# Patient Record
Sex: Female | Born: 1965 | Race: White | Hispanic: No | Marital: Married | Smoking: Current every day smoker
Health system: Southern US, Community
[De-identification: ages and names within clinical notes are randomized; demographics above are authoritative.]

## PROBLEM LIST (undated history)

## (undated) DIAGNOSIS — G4700 Insomnia, unspecified: Secondary | ICD-10-CM

## (undated) DIAGNOSIS — F419 Anxiety disorder, unspecified: Secondary | ICD-10-CM

## (undated) HISTORY — PX: TONSILLECTOMY: SUR1361

## (undated) HISTORY — PX: ROTATOR CUFF REPAIR: SHX139

---

## 2012-12-17 ENCOUNTER — Other Ambulatory Visit: Payer: Self-pay | Admitting: Podiatry

## 2013-01-29 ENCOUNTER — Encounter (HOSPITAL_COMMUNITY): Payer: Self-pay | Admitting: Pharmacy Technician

## 2013-02-04 NOTE — Patient Instructions (Signed)
Melissa Sanders  02/04/2013   Your procedure is scheduled on:  02/13/2013  Report [facility] at  615  AM.  Call this number if you have problems the morning of surgery: [phone]   Remember:   Do not eat food or drink liquids after midnight.   Take these medicines the morning of surgery with A SIP OF WATER:  xanax   Do not wear jewelry, make-up or nail polish.  Do not wear lotions, powders, or perfumes.   Do not shave 48 hours prior to surgery. Men may shave face and neck.  Do not bring valuables to the hospital.  [facility] any belongings or valuables.               Contacts, dentures or bridgework may not be worn into surgery.  Leave suitcase in the car. After surgery it may be brought to your room.  For patients admitted to the hospital, discharge time is determined by your treatment team.               Patients discharged the day of surgery will not be allowed to drive home.  Name and phone number of your driver: family  Special Instructions: Shower using CHG 2 nights before surgery and the night before surgery.  If you shower the day of surgery use CHG.  Use special wash - you have one bottle of CHG for all showers.  You should use approximately 1/3 of the bottle for each shower.   Please read over the following fact sheets that you were given: Pain Booklet, Coughing and Deep Breathing, MRSA Information and Surgical Site Infection Prevention Hammer Toes Hammer toes occur when the joint in one or more of your toes is permanently flexed. CAUSES  This happens when a muscle imbalance or abnormal bone length makes the small toes buckle under. This causes the toe joint to contract. This causes the tendons (cord like structure) to shorten.  SYMPTOMS   When hammer toes are flexible, you can straighten the buckled joint with your hand. Flexible hammer toes may develop into rigid hammer toes over time. Common symptoms of flexible hammer toes  include:  Corns (build-ups of skin cells). Corns occur where boney bumps come in frequent contact with hard surfaces. For example, where your shoes press and rub.  Irritation.  Inflammation.  Pain.  Toe motion is limited.  When a rigid hammer toe is fixed you can no longer straighten the buckled joint. Corns, irritation, pain, and loss of motion is generally worse for rigid hammer toes than for flexible ones. TREATMENT  The problems noted above if painful or troublesome can be corrected with surgery. This is an elective surgery, so you can pick a convenient time for the procedure. The surgery may:  Improve appearance.  Relieve pain.  Improve function. You may be asked not to put weight on this foot for a few weeks. There are several types of surgical treatments. Common treatments are listed below. Your surgeon will discuss what is be best for you.   With arthroplasty, a portion of the joint is surgically removed and the toe is straightened. The "gap" fills in with fibroustissue. This helps with pain, deformity and function.  With fusion, cartilage between the two bones is taken out and the bones heal as one longer bone. This helps keep the toe stable and reduces pain but leaves the toe stiff, yet straight.  With implant, a portion of  the bone is removed and replaced with an implant to restore motion.  Flexor tendon transfers may be used to release the deforming force which buckles the toe. This is done by the repositioning of the tendons that curl the toes down (flexor tendons). Several of these options require fixing the toe with a pin that is visible at the tip of the toe. The pin keeps the toe straight during healing. It is generally removed in the office at 4-8 weeks after the corrective procedure. Generally, removing the pin is not painful.  LET YOUR CAREGIVER KNOW ABOUT:  Allergies.  Medications taken including herbs, eye drops, over the counter medications, and  creams.  Use of steroids (by mouth or creams).  Previous problems with anesthetics or novocaine.  Possibility of pregnancy, if this applies.  History of blood clots (thrombophlebitis).  History of bleeding or blood problems.  Previous surgery.  Other health problems.  Family history of anesthetic problems. BEFORE THE PROCEDURE You should be present 60 minutes prior to your procedure unless otherwise directed by your caregiver.  RISKS AND COMPLICATIONS  If surgery is recommended, your caregiver will explain your foot problem and how surgery can improve it. Your caregiver can answer questions you may have about potential risks and complications involved.  Let your caregiver know about health changes prior to surgery. It is best to do elective surgeries when you are healthy. Be sure to ask your caregiver how long you will be off your feet and if you need to be off work. Plan accordingly. Your foot and ankle may be immobilized by a cast (from your toes to below your knee). You may be asked not to bear weight on this foot for a few weeks. AFTER THE PROCEDURE You can bear weight as instructed. You may need a bandage, splint, and removable cast boot or surgical shoe for several weeks after surgery. You may resume normal diet and activities as directed. Only take over-the-counter or prescription medicines for pain, discomfort, or fever as directed by your caregiver. SEEK MEDICAL CARE IF:   You have increased bleeding (more than a small spot) from the wound.  You notice redness, swelling, or increasing pain in the wound.  You notice pus coming from the wound or the pin that is used to stabilize the toe.  You notice a bad smell coming from the wound or dressing. SEEK IMMEDIATE MEDICAL CARE IF:   You have a fever.  You develop a rash.  You have difficulty breathing.  You have any allergic problems. Document Released: 01/14/2000 Document Revised: 04/10/2011 Document Reviewed:  02/14/2008 [facility] Patient Information 2014 [place], [place]. PATIENT INSTRUCTIONS POST-ANESTHESIA  IMMEDIATELY FOLLOWING SURGERY:  Do not drive or operate machinery for the first twenty four hours after surgery.  Do not make any important decisions for twenty four hours after surgery or while taking narcotic pain medications or sedatives.  If you develop intractable nausea and vomiting or a severe headache please notify your doctor immediately.  FOLLOW-UP:  Please make an appointment with your surgeon as instructed. You do not need to follow up with anesthesia unless specifically instructed to do so.  WOUND CARE INSTRUCTIONS (if applicable):  Keep a dry clean dressing on the anesthesia/puncture wound site if there is drainage.  Once the wound has quit draining you may leave it open to air.  Generally you should leave the bandage intact for twenty four hours unless there is drainage.  If the epidural site drains for more than 36-48 hours please  call the anesthesia department.  QUESTIONS?:  Please feel free to call your physician or the hospital operator if you have any questions, and they will be happy to assist you.

## 2013-02-05 ENCOUNTER — Encounter (HOSPITAL_COMMUNITY): Payer: Self-pay

## 2013-02-05 ENCOUNTER — Ambulatory Visit (HOSPITAL_COMMUNITY)
Admission: RE | Admit: 2013-02-05 | Discharge: 2013-02-05 | Disposition: A | Discharge status: Home / Self Care | Payer: BC Managed Care – PPO | Source: Ambulatory Visit | Attending: Podiatry | Admitting: Podiatry

## 2013-02-05 ENCOUNTER — Encounter (HOSPITAL_COMMUNITY)
Admission: RE | Admit: 2013-02-05 | Discharge: 2013-02-05 | Disposition: A | Discharge status: Home / Self Care | Payer: BC Managed Care – PPO | Source: Ambulatory Visit | Attending: Podiatry | Admitting: Podiatry

## 2013-02-05 DIAGNOSIS — Z01812 Encounter for preprocedural laboratory examination: Secondary | ICD-10-CM | POA: Insufficient documentation

## 2013-02-05 DIAGNOSIS — Z01818 Encounter for other preprocedural examination: Secondary | ICD-10-CM | POA: Insufficient documentation

## 2013-02-05 HISTORY — DX: Anxiety disorder, unspecified: F41.9

## 2013-02-05 HISTORY — DX: Insomnia, unspecified: G47.00

## 2013-02-05 LAB — BASIC METABOLIC PANEL
BUN: 13 mg/dL (ref 6–23)
CO2: 24 meq/L (ref 19–32)
CREATININE: 0.75 mg/dL (ref 0.50–1.10)
Calcium: 8.8 mg/dL (ref 8.4–10.5)
Chloride: 104 mEq/L (ref 96–112)
GFR calc Af Amer: >90 mL/min (ref >90)
GFR calc non Af Amer: >90 mL/min (ref >90)
GLUCOSE: 88 mg/dL (ref 70–99)
Potassium: 3.6 mEq/L — ABNORMAL LOW (ref 3.7–5.3)
SODIUM: 140 meq/L (ref 137–147)

## 2013-02-05 LAB — HCG, SERUM, QUALITATIVE: PREG SERUM: NEGATIVE

## 2013-02-05 LAB — HEMOGLOBIN AND HEMATOCRIT, BLOOD
HEMATOCRIT: 38.7 % (ref 36.0–46.0)
Hemoglobin: 13.3 g/dL (ref 12.0–15.0)

## 2013-02-13 ENCOUNTER — Ambulatory Visit (HOSPITAL_COMMUNITY): Payer: BC Managed Care – PPO

## 2013-02-13 ENCOUNTER — Encounter (HOSPITAL_COMMUNITY)
Admission: RE | Disposition: A | Discharge status: Home / Self Care | Payer: Self-pay | Source: Ambulatory Visit | Attending: Podiatry

## 2013-02-13 ENCOUNTER — Ambulatory Visit (HOSPITAL_COMMUNITY): Payer: BC Managed Care – PPO | Admitting: Anesthesiology

## 2013-02-13 ENCOUNTER — Ambulatory Visit (HOSPITAL_COMMUNITY)
Admission: RE | Admit: 2013-02-13 | Discharge: 2013-02-13 | Disposition: A | Discharge status: Home / Self Care | Payer: BC Managed Care – PPO | Source: Ambulatory Visit | Attending: Podiatry | Admitting: Podiatry

## 2013-02-13 ENCOUNTER — Encounter (HOSPITAL_COMMUNITY): Payer: BC Managed Care – PPO | Admitting: Anesthesiology

## 2013-02-13 ENCOUNTER — Encounter (HOSPITAL_COMMUNITY): Payer: Self-pay | Admitting: *Deleted

## 2013-02-13 DIAGNOSIS — M205X9 Other deformities of toe(s) (acquired), unspecified foot: Secondary | ICD-10-CM | POA: Insufficient documentation

## 2013-02-13 DIAGNOSIS — M2042 Other hammer toe(s) (acquired), left foot: Secondary | ICD-10-CM

## 2013-02-13 HISTORY — PX: HAMMER TOE SURGERY: SHX385

## 2013-02-13 SURGERY — CORRECTION, HAMMER TOE
Anesthesia: Monitor Anesthesia Care | Site: Toe | Laterality: Left

## 2013-02-13 MED ORDER — MIDAZOLAM HCL 2 MG/2ML IJ SOLN
INTRAMUSCULAR | Status: AC
  Filled 2013-02-13: qty 2

## 2013-02-13 MED ORDER — FENTANYL CITRATE 0.05 MG/ML IJ SOLN
INTRAMUSCULAR | Status: AC
  Filled 2013-02-13: qty 2

## 2013-02-13 MED ORDER — ONDANSETRON HCL 4 MG/2ML IJ SOLN
4.0000 mg | Freq: Once | INTRAMUSCULAR | Status: AC
  Administered 2013-02-13: 4 mg via INTRAVENOUS

## 2013-02-13 MED ORDER — 0.9 % SODIUM CHLORIDE (POUR BTL) OPTIME
TOPICAL | Status: DC | PRN
  Administered 2013-02-13: 1000 mL

## 2013-02-13 MED ORDER — MIDAZOLAM HCL 2 MG/2ML IJ SOLN
1.0000 mg | INTRAMUSCULAR | Status: DC | PRN
  Administered 2013-02-13: 2 mg via INTRAVENOUS

## 2013-02-13 MED ORDER — FENTANYL CITRATE 0.05 MG/ML IJ SOLN
INTRAMUSCULAR | Status: DC | PRN
  Administered 2013-02-13 (×3): 25 ug via INTRAVENOUS

## 2013-02-13 MED ORDER — FENTANYL CITRATE 0.05 MG/ML IJ SOLN: 25.0000 ug | INTRAMUSCULAR | Status: DC | PRN

## 2013-02-13 MED ORDER — PROPOFOL 10 MG/ML IV EMUL
INTRAVENOUS | Status: AC
Start: 2013-02-13 — End: 2013-02-13
  Filled 2013-02-13: qty 20

## 2013-02-13 MED ORDER — ONDANSETRON HCL 4 MG/2ML IJ SOLN
INTRAMUSCULAR | Status: AC
  Filled 2013-02-13: qty 2

## 2013-02-13 MED ORDER — MIDAZOLAM HCL 5 MG/5ML IJ SOLN
INTRAMUSCULAR | Status: DC | PRN
  Administered 2013-02-13 (×2): 1 mg via INTRAVENOUS

## 2013-02-13 MED ORDER — ONDANSETRON HCL 4 MG/2ML IJ SOLN: 4.0000 mg | Freq: Once | INTRAMUSCULAR | Status: DC | PRN

## 2013-02-13 MED ORDER — LIDOCAINE HCL (PF) 1 % IJ SOLN
INTRAMUSCULAR | Status: AC
Start: 2013-02-13 — End: 2013-02-13
  Filled 2013-02-13: qty 5

## 2013-02-13 MED ORDER — LACTATED RINGERS IV SOLN
INTRAVENOUS | Status: DC
  Administered 2013-02-13: 1000 mL via INTRAVENOUS

## 2013-02-13 MED ORDER — BUPIVACAINE HCL (PF) 0.5 % IJ SOLN
INTRAMUSCULAR | Status: AC
Start: 2013-02-13 — End: 2013-02-13
  Filled 2013-02-13: qty 30

## 2013-02-13 MED ORDER — PROPOFOL INFUSION 10 MG/ML OPTIME
INTRAVENOUS | Status: DC | PRN
  Administered 2013-02-13: 75 ug/kg/min via INTRAVENOUS

## 2013-02-13 MED ORDER — FENTANYL CITRATE 0.05 MG/ML IJ SOLN
25.0000 ug | INTRAMUSCULAR | Status: AC
  Administered 2013-02-13: 25 ug via INTRAVENOUS

## 2013-02-13 MED ORDER — CLINDAMYCIN PHOSPHATE 600 MG/50ML IV SOLN
600.0000 mg | Freq: Once | INTRAVENOUS | Status: AC
  Administered 2013-02-13: 600 mg via INTRAVENOUS
  Filled 2013-02-13: qty 50

## 2013-02-13 MED ORDER — BUPIVACAINE HCL (PF) 0.5 % IJ SOLN
INTRAMUSCULAR | Status: DC | PRN
  Administered 2013-02-13 (×2): 10 mL

## 2013-02-13 SURGICAL SUPPLY — 51 items
BAG HAMPER (MISCELLANEOUS) ×3 IMPLANT
BANDAGE CONFORM 2  STR LF (GAUZE/BANDAGES/DRESSINGS) ×3 IMPLANT
BANDAGE ELASTIC 4 VELCRO NS (GAUZE/BANDAGES/DRESSINGS) ×3 IMPLANT
BANDAGE ESMARK 4X12 BL STRL LF (DISPOSABLE) ×1 IMPLANT
BANDAGE GAUZE ELAST BULKY 4 IN (GAUZE/BANDAGES/DRESSINGS) ×3 IMPLANT
BENZOIN TINCTURE PRP APPL 2/3 (GAUZE/BANDAGES/DRESSINGS) ×3 IMPLANT
BLADE OSC/SAG 11.5X5.5X.38 (BLADE) ×3 IMPLANT
BLADE OSC/SAG 18.5X9 THN (BLADE) IMPLANT
BLADE OSC/SAGITTAL MD 5.5X18 (BLADE) IMPLANT
BLADE SURG 15 STRL LF DISP TIS (BLADE) ×1 IMPLANT
BLADE SURG 15 STRL SS (BLADE) ×2
BNDG ESMARK 4X12 BLUE STRL LF (DISPOSABLE) ×3
CHLORAPREP W/TINT 26ML (MISCELLANEOUS) ×3 IMPLANT
CLOSURE WOUND 1/2 X4 (GAUZE/BANDAGES/DRESSINGS) ×1
CLOTH BEACON ORANGE TIMEOUT ST (SAFETY) ×3 IMPLANT
COVER LIGHT HANDLE STERIS (MISCELLANEOUS) ×6 IMPLANT
CUFF TOURNIQUET SINGLE 18IN (TOURNIQUET CUFF) ×3 IMPLANT
DECANTER SPIKE VIAL GLASS SM (MISCELLANEOUS) ×3 IMPLANT
DRAPE OEC MINIVIEW 54X84 (DRAPES) ×3 IMPLANT
DRSG ADAPTIC 3X8 NADH LF (GAUZE/BANDAGES/DRESSINGS) ×3 IMPLANT
DURA STEPPER LG (CAST SUPPLIES) IMPLANT
DURA STEPPER MED (CAST SUPPLIES) IMPLANT
DURA STEPPER SML (CAST SUPPLIES) IMPLANT
DURA STEPPER XL (SOFTGOODS) IMPLANT
ELECT REM PT RETURN 9FT ADLT (ELECTROSURGICAL) ×3
ELECTRODE REM PT RTRN 9FT ADLT (ELECTROSURGICAL) ×1 IMPLANT
GLOVE BIO SURGEON STRL SZ7.5 (GLOVE) ×3 IMPLANT
GLOVE BIOGEL PI IND STRL 7.0 (GLOVE) ×3 IMPLANT
GLOVE BIOGEL PI INDICATOR 7.0 (GLOVE) ×6
GLOVE ECLIPSE 6.5 STRL STRAW (GLOVE) ×3 IMPLANT
GLOVE ECLIPSE 7.0 STRL STRAW (GLOVE) ×3 IMPLANT
GOWN STRL REUS W/TWL LRG LVL3 (GOWN DISPOSABLE) ×9 IMPLANT
K-WIRE 6 (WIRE)
KIT ROOM TURNOVER APOR (KITS) ×3 IMPLANT
KWIRE 6 (WIRE) IMPLANT
MANIFOLD NEPTUNE II (INSTRUMENTS) ×3 IMPLANT
NEEDLE HYPO 27GX1-1/4 (NEEDLE) ×6 IMPLANT
NS IRRIG 1000ML POUR BTL (IV SOLUTION) ×3 IMPLANT
PACK BASIC LIMB (CUSTOM PROCEDURE TRAY) ×3 IMPLANT
PAD ARMBOARD 7.5X6 YLW CONV (MISCELLANEOUS) ×3 IMPLANT
PIN CAPS ORTHO GREEN .062 (PIN) IMPLANT
RASP SM TEAR CROSS CUT (RASP) IMPLANT
SET BASIN LINEN APH (SET/KITS/TRAYS/PACK) ×3 IMPLANT
SPONGE GAUZE 4X4 12PLY (GAUZE/BANDAGES/DRESSINGS) ×3 IMPLANT
SPONGE LAP 18X18 X RAY DECT (DISPOSABLE) ×3 IMPLANT
STRIP CLOSURE SKIN 1/2X4 (GAUZE/BANDAGES/DRESSINGS) ×2 IMPLANT
SUT PROLENE 4 0 PS 2 18 (SUTURE) ×3 IMPLANT
SUT VIC AB 4-0 PS2 27 (SUTURE) ×6 IMPLANT
SUT VICRYL AB 3-0 FS1 BRD 27IN (SUTURE) IMPLANT
SYR CONTROL 10ML LL (SYRINGE) ×9 IMPLANT
TOWEL OR 17X26 4PK STRL BLUE (TOWEL DISPOSABLE) IMPLANT

## 2013-02-13 NOTE — Op Note (Signed)
OPERATIVE NOTE  DATE OF PROCEDURE:  02/13/2013  SURGEON:   Dallas SchimkeBenjamin Ivan Caitlain Tweed, DPM  OR STAFF:   Circulator: Cyndie Chimeanya Jarrell Smith, RN Scrub Person: Diana EvesWendy J Cain, CST RN First Assistant: Eliane Decreeatherine Jann Page, RN   PREOPERATIVE DIAGNOSIS:   1.  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 2.  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  POSTOPERATIVE DIAGNOSIS: Same  PROCEDURE: 1.  Arthroplasty 4th digit, left foot. 2.  Arthroplasty 5th digit, left foot.  ANESTHESIA:  Monitor Anesthesia Care   HEMOSTASIS:   Pneumatic ankle tourniquet set at 250 mmHg  ESTIMATED BLOOD LOSS:   Minimal (<5 cc)  MATERIALS USED:  None  INJECTABLES: Marcaine 0.5% plain; 20mL  PATHOLOGY:   None  COMPLICATIONS:   None  INDICATIONS:  Painful, recurrent hyperkeratotic lesion within the 4th webspace of the left foot  DESCRIPTION OF THE PROCEDURE:   The patient was brought to the operating room and placed on the operative table in the supine position.  A pneumatic ankle tourniquet was applied to the patient's ankle.  Following sedation, the surgical site was anesthetized with 0.5% Marcaine plain.  The foot was then prepped, scrubbed, and draped in the usual sterile technique.  The foot was elevated, exsanguinated and the pneumatic ankle tourniquet inflated to 250 mmHg.    Attention was directed to the dorsal aspect of the left fourth toe.  A linear longitudinal incision was made.  Dissection was continued deep down to the level of the proximal interphalangeal joint.  A transverse tenotomy capsulotomy was performed.  The head of the proximal phalanx was freed of all soft tissue attachments.  The head of the proximal phalanx was resected using a power bone saw.  The redundancy in the extensor tendon was resected using a 15 blade.  The wound was irrigated with copious amounts of sterile irrigant.  The extensor tendon was reapproximated using 4-0 Vicryl in a simple suture technique.  The skin was  reapproximated using 4-0 Prolene in a simple suture technique.  Attention was directed to the dorsal aspect of the left fifth toe.  2 converging semi-elliptical incisions were made overlying the proximal interphalangeal joint.  Incision placement was oriented from a proximal lateral to distal medial direction.  The wedge of skin was removed and passed from the operative field.  Dissection was continued deep down to the level of the proximal interphalangeal joint.  A transverse tenotomy capsulotomy was performed.  The head of the proximal phalanx was resected using a power bone saw.  The toe was derotated.  The extensor tendon was reapproximated using 4-0 Vicryl in a simple suture technique.  The skin was reapproximated using Prolene in a simple suture technique.  Steri-Strips were applied to reinforce the closure.  A sterile compressive dressing was applied to the left foot.  The pneumatic ankle tourniquet was deflated and a prompt hyperemic response was noted to all digits of the left foot.  The patient tolerated the procedure well.  The patient was then transferred to PACU with vital signs stable and vascular status intact to all toes of the operative foot.  Following a period of postoperative monitoring, the patient will be discharged home.

## 2013-02-13 NOTE — Addendum Note (Signed)
Addendum created 02/13/13 1024 by Despina Hiddenobert J Giani Winther, CRNA   Modules edited: Anesthesia Medication Administration

## 2013-02-13 NOTE — Anesthesia Postprocedure Evaluation (Signed)
  Anesthesia Post-op Note  Patient: Melissa Sanders  Procedure(s) Performed: Procedure(s): HAMMER TOE REPAIR 4TH & 5TH DIGITS LEFT FOOT (Left)  Patient Location: PACU  Anesthesia Type:MAC  Level of Consciousness: awake, alert  and oriented  Airway and Oxygen Therapy: Patient Spontanous Breathing and Patient connected to face mask oxygen  Post-op Pain: none  Post-op Assessment: Post-op Vital signs reviewed, Patient's Cardiovascular Status Stable, Respiratory Function Stable, Patent Airway and No signs of Nausea or vomiting  Post-op Vital Signs: Reviewed and stable  Complications: No apparent anesthesia complications

## 2013-02-13 NOTE — Transfer of Care (Signed)
Immediate Anesthesia Transfer of Care Note  Patient: Melissa CanterSherry C Sanders  Procedure(s) Performed: Procedure(s): HAMMER TOE REPAIR 4TH & 5TH DIGITS LEFT FOOT (Left)  Patient Location: PACU  Anesthesia Type:MAC  Level of Consciousness: awake, alert  and oriented  Airway & Oxygen Therapy: Patient Spontanous Breathing  Post-op Assessment: Report given to PACU RN  Post vital signs: Reviewed  Complications: No apparent anesthesia complications

## 2013-02-13 NOTE — Anesthesia Preprocedure Evaluation (Addendum)
Anesthesia Evaluation  Patient identified by MRN, date of birth, ID band Patient awake    Reviewed: Allergy & Precautions, H&P , NPO status , Patient's Chart, lab work & pertinent test results  Airway Mallampati: II TM Distance: >3 FB Neck ROM: Full    Dental  (+) Teeth Intact and Implants   Pulmonary Current Smoker,  breath sounds clear to auscultation        Cardiovascular negative cardio ROS  Rhythm:Regular Rate:Normal     Neuro/Psych PSYCHIATRIC DISORDERS Anxiety    GI/Hepatic negative GI ROS,   Endo/Other    Renal/GU      Musculoskeletal   Abdominal   Peds  Hematology   Anesthesia Other Findings   Reproductive/Obstetrics                          Anesthesia Physical Anesthesia Plan  ASA: II  Anesthesia Plan: MAC   Post-op Pain Management:    Induction: Intravenous  Airway Management Planned: Nasal Cannula  Additional Equipment:   Intra-op Plan:   Post-operative Plan:   Informed Consent: I have reviewed the patients History and Physical, chart, labs and discussed the procedure including the risks, benefits and alternatives for the proposed anesthesia with the patient or authorized representative who has indicated his/her understanding and acceptance.     Plan Discussed with:   Anesthesia Plan Comments:         Anesthesia Quick Evaluation

## 2013-02-13 NOTE — H&P (Signed)
HISTORY AND PHYSICAL INTERVAL NOTE:  02/13/2013  7:32 AM  Melissa Sanders  has presented today for surgery, with the diagnosis of hammer toe deformity 4th and 5th digits left foot.  The various methods of treatment have been discussed with the patient.  No guarantees were given.  After consideration of risks, benefits and other options for treatment, the patient has consented to surgery.  I have reviewed the patients' chart and labs.    Patient Vitals for the past 24 hrs:  BP Temp Temp src Pulse Resp SpO2 Height Weight  02/13/13 0720 97/65 mmHg - - - 20 100 % - -  02/13/13 0715 94/68 mmHg - - - 18 100 % - -  02/13/13 0710 102/70 mmHg - - - 22 98 % - -  02/13/13 0705 100/69 mmHg - - - 20 98 % - -  02/13/13 0700 100/69 mmHg - - - 21 99 % - -  02/13/13 0655 106/71 mmHg - - - 20 99 % - -  02/13/13 0650 109/71 mmHg - - - 20 99 % - -  02/13/13 0639 101/71 mmHg 98.5 F (36.9 C) Oral 72 18 - 5\' 2"  (1.575 m) 129 lb (58.514 kg)    A history and physical examination was performed in my office.  The patient was reexamined.  There have been no changes to this history and physical examination.  Dallas SchimkeBenjamin Ivan Dhalia Zingaro, DPM

## 2013-02-14 ENCOUNTER — Encounter (HOSPITAL_COMMUNITY): Payer: Self-pay | Admitting: Podiatry

## 2014-01-05 ENCOUNTER — Other Ambulatory Visit: Payer: Self-pay | Admitting: Podiatry

## 2014-01-07 NOTE — Addendum Note (Signed)
Addended by: Jannetta Massey on: 01/07/2014 12:48 PM   Modules accepted: Orders  

## 2014-02-16 ENCOUNTER — Encounter (HOSPITAL_COMMUNITY): Payer: Self-pay

## 2014-02-16 ENCOUNTER — Encounter (HOSPITAL_COMMUNITY)
Admission: RE | Admit: 2014-02-16 | Discharge: 2014-02-16 | Disposition: A | Discharge status: Home / Self Care | Payer: BLUE CROSS/BLUE SHIELD | Source: Ambulatory Visit | Attending: Podiatry | Admitting: Podiatry

## 2014-02-16 DIAGNOSIS — Z01812 Encounter for preprocedural laboratory examination: Secondary | ICD-10-CM | POA: Diagnosis present

## 2014-02-16 DIAGNOSIS — M2042 Other hammer toe(s) (acquired), left foot: Secondary | ICD-10-CM | POA: Diagnosis not present

## 2014-02-16 LAB — BASIC METABOLIC PANEL
ANION GAP: 6 (ref 5–15)
BUN: 11 mg/dL (ref 6–23)
CALCIUM: 9.3 mg/dL (ref 8.4–10.5)
CHLORIDE: 105 meq/L (ref 96–112)
CO2: 24 mmol/L (ref 19–32)
Creatinine, Ser: 0.73 mg/dL (ref 0.50–1.10)
GFR calc non Af Amer: >90 mL/min (ref >90)
Glucose, Bld: 91 mg/dL (ref 70–99)
Potassium: 4.4 mmol/L (ref 3.5–5.1)
SODIUM: 135 mmol/L (ref 135–145)

## 2014-02-16 LAB — CBC
HCT: 40.8 % (ref 36.0–46.0)
Hemoglobin: 14.3 g/dL (ref 12.0–15.0)
MCH: 31.6 pg (ref 26.0–34.0)
MCHC: 35 g/dL (ref 30.0–36.0)
MCV: 90.3 fL (ref 78.0–100.0)
PLATELETS: 242 10*3/uL (ref 150–400)
RBC: 4.52 MIL/uL (ref 3.87–5.11)
RDW: 13.3 % (ref 11.5–15.5)
WBC: 8.7 10*3/uL (ref 4.0–10.5)

## 2014-02-16 LAB — HCG, SERUM, QUALITATIVE: Preg, Serum: NEGATIVE

## 2014-02-16 NOTE — Patient Instructions (Addendum)
     Melissa CanterSherry C Sanders  02/16/2014   Your procedure is scheduled on:   02/25/2014  [facility] at  850  AM.  Call this number if you have problems the morning of surgery: [phone]   Remember:   Do not eat food or drink liquids after midnight.   Take these medicines the morning of surgery with A SIP OF WATER:  xanax   Do not wear jewelry, make-up or nail polish.  Do not wear lotions, powders, or perfumes.   Do not shave 48 hours prior to surgery. Men may shave face and neck.  Do not bring valuables to the hospital.  [facility] not responsible for any belongings or valuables.               Contacts, dentures or bridgework may not be worn into surgery.  Leave suitcase in the car. After surgery it may be brought to your room.  For patients admitted to the hospital, discharge time is determined by your treatment team.               Patients discharged the day of surgery will not be allowed to drive home.  Name and phone number of your driver: family  Special Instructions: Shower using CHG 2 nights before surgery and the night before surgery.  If you shower the day of surgery use CHG.  Use special wash - you have one bottle of CHG for all showers.  You should use approximately 1/3 of the bottle for each shower.   Please read over the following fact sheets that you were given: Pain Booklet, Coughing and Deep Breathing, Surgical Site Infection Prevention, Anesthesia Post-op Instructions and Care and Recovery After Surgery  PATIENT INSTRUCTIONS POST-ANESTHESIA  IMMEDIATELY FOLLOWING SURGERY:  Do not drive or operate machinery for the first twenty four hours after surgery.  Do not make any important decisions for twenty four hours after surgery or while taking narcotic pain medications or sedatives.  If you develop intractable nausea and vomiting or a severe headache please notify your doctor immediately.  FOLLOW-UP:  Please make an appointment with your surgeon as instructed.  You do not need to follow up with anesthesia unless specifically instructed to do so.  WOUND CARE INSTRUCTIONS (if applicable):  Keep a dry clean dressing on the anesthesia/puncture wound site if there is drainage.  Once the wound has quit draining you may leave it open to air.  Generally you should leave the bandage intact for twenty four hours unless there is drainage.  If the epidural site drains for more than 36-48 hours please call the anesthesia department.  QUESTIONS?:  Please feel free to call your physician or the hospital operator if you have any questions, and they will be happy to assist you.

## 2014-02-16 NOTE — Pre-Procedure Instructions (Signed)
Patient given information to sign up for my chart at home. 

## 2014-02-25 ENCOUNTER — Ambulatory Visit (HOSPITAL_COMMUNITY): Payer: BLUE CROSS/BLUE SHIELD | Admitting: Certified Registered Nurse Anesthetist

## 2014-02-25 ENCOUNTER — Encounter (HOSPITAL_COMMUNITY)
Admission: RE | Disposition: A | Discharge status: Home / Self Care | Payer: Self-pay | Source: Ambulatory Visit | Attending: Podiatry

## 2014-02-25 ENCOUNTER — Encounter (HOSPITAL_COMMUNITY): Payer: Self-pay | Admitting: *Deleted

## 2014-02-25 ENCOUNTER — Ambulatory Visit (HOSPITAL_COMMUNITY)
Admission: RE | Admit: 2014-02-25 | Discharge: 2014-02-25 | Disposition: A | Discharge status: Home / Self Care | Payer: BLUE CROSS/BLUE SHIELD | Source: Ambulatory Visit | Attending: Podiatry | Admitting: Podiatry

## 2014-02-25 DIAGNOSIS — M2042 Other hammer toe(s) (acquired), left foot: Secondary | ICD-10-CM | POA: Diagnosis not present

## 2014-02-25 HISTORY — PX: CAPSULOTOMY: SHX379

## 2014-02-25 HISTORY — PX: FLEXOR TENOTOMY: SHX6342

## 2014-02-25 SURGERY — TENOTOMY, FLEXOR
Anesthesia: Monitor Anesthesia Care | Site: Foot | Laterality: Left

## 2014-02-25 MED ORDER — ONDANSETRON HCL 4 MG/2ML IJ SOLN
4.0000 mg | Freq: Once | INTRAMUSCULAR | Status: AC
  Administered 2014-02-25: 4 mg via INTRAVENOUS

## 2014-02-25 MED ORDER — ONDANSETRON HCL 4 MG/2ML IJ SOLN
INTRAMUSCULAR | Status: AC
  Filled 2014-02-25: qty 2

## 2014-02-25 MED ORDER — LIDOCAINE HCL (CARDIAC) 10 MG/ML IV SOLN
INTRAVENOUS | Status: DC | PRN
  Administered 2014-02-25: 60 mg via INTRAVENOUS

## 2014-02-25 MED ORDER — LACTATED RINGERS IV SOLN
INTRAVENOUS | Status: DC | PRN
  Administered 2014-02-25: 12:00:00 via INTRAVENOUS

## 2014-02-25 MED ORDER — MIDAZOLAM HCL 2 MG/2ML IJ SOLN
1.0000 mg | INTRAMUSCULAR | Status: DC | PRN
Start: 2014-02-25 — End: 2014-02-25
  Administered 2014-02-25 (×2): 2 mg via INTRAVENOUS

## 2014-02-25 MED ORDER — BUPIVACAINE HCL (PF) 0.5 % IJ SOLN
INTRAMUSCULAR | Status: AC
  Filled 2014-02-25: qty 30

## 2014-02-25 MED ORDER — FENTANYL CITRATE 0.05 MG/ML IJ SOLN
INTRAMUSCULAR | Status: AC
Start: 2014-02-25 — End: 2014-02-25
  Filled 2014-02-25: qty 2

## 2014-02-25 MED ORDER — DEXAMETHASONE SODIUM PHOSPHATE 4 MG/ML IJ SOLN
INTRAMUSCULAR | Status: AC
  Filled 2014-02-25: qty 1

## 2014-02-25 MED ORDER — BUPIVACAINE HCL (PF) 0.5 % IJ SOLN
INTRAMUSCULAR | Status: DC | PRN
  Administered 2014-02-25: 10 mL

## 2014-02-25 MED ORDER — FENTANYL CITRATE 0.05 MG/ML IJ SOLN
25.0000 ug | INTRAMUSCULAR | Status: AC
  Administered 2014-02-25 (×2): 25 ug via INTRAVENOUS

## 2014-02-25 MED ORDER — CLINDAMYCIN PHOSPHATE 600 MG/50ML IV SOLN
INTRAVENOUS | Status: AC
  Filled 2014-02-25: qty 50

## 2014-02-25 MED ORDER — PROPOFOL 10 MG/ML IV BOLUS
INTRAVENOUS | Status: AC
  Filled 2014-02-25: qty 20

## 2014-02-25 MED ORDER — PROPOFOL 10 MG/ML IV BOLUS
INTRAVENOUS | Status: DC | PRN
  Administered 2014-02-25: 100 mg via INTRAVENOUS

## 2014-02-25 MED ORDER — FENTANYL CITRATE 0.05 MG/ML IJ SOLN
INTRAMUSCULAR | Status: DC | PRN
  Administered 2014-02-25 (×2): 25 ug via INTRAVENOUS

## 2014-02-25 MED ORDER — MIDAZOLAM HCL 2 MG/2ML IJ SOLN
INTRAMUSCULAR | Status: AC
  Filled 2014-02-25: qty 2

## 2014-02-25 MED ORDER — DEXAMETHASONE SODIUM PHOSPHATE 4 MG/ML IJ SOLN
4.0000 mg | Freq: Once | INTRAMUSCULAR | Status: AC
  Administered 2014-02-25: 4 mg via INTRAVENOUS

## 2014-02-25 MED ORDER — SODIUM CHLORIDE 0.9 % IR SOLN
Status: DC | PRN
  Administered 2014-02-25: 1000 mL

## 2014-02-25 MED ORDER — CLINDAMYCIN PHOSPHATE 600 MG/50ML IV SOLN
INTRAVENOUS | Status: DC | PRN
  Administered 2014-02-25: 600 mg via INTRAVENOUS

## 2014-02-25 MED ORDER — CLINDAMYCIN PHOSPHATE 600 MG/50ML IV SOLN: 600.0000 mg | Freq: Once | INTRAVENOUS | Status: DC

## 2014-02-25 MED ORDER — LACTATED RINGERS IV SOLN
INTRAVENOUS | Status: DC
  Administered 2014-02-25: 10:00:00 via INTRAVENOUS

## 2014-02-25 MED ORDER — FENTANYL CITRATE 0.05 MG/ML IJ SOLN
INTRAMUSCULAR | Status: AC
  Filled 2014-02-25: qty 2

## 2014-02-25 SURGICAL SUPPLY — 35 items
BAG HAMPER (MISCELLANEOUS) ×3 IMPLANT
BANDAGE CONFORM 2  STR LF (GAUZE/BANDAGES/DRESSINGS) ×3 IMPLANT
BANDAGE ELASTIC 4 VELCRO NS (GAUZE/BANDAGES/DRESSINGS) ×3 IMPLANT
BANDAGE ESMARK 4X12 BL STRL LF (DISPOSABLE) ×1 IMPLANT
BLADE AVERAGE 25MMX9MM (BLADE) ×1
BLADE AVERAGE 25X9 (BLADE) ×2 IMPLANT
BLADE SURG 15 STRL LF DISP TIS (BLADE) ×2 IMPLANT
BLADE SURG 15 STRL SS (BLADE) ×4
BNDG ESMARK 4X12 BLUE STRL LF (DISPOSABLE) ×3
BNDG GAUZE ELAST 4 BULKY (GAUZE/BANDAGES/DRESSINGS) ×3 IMPLANT
CHLORAPREP W/TINT 26ML (MISCELLANEOUS) ×3 IMPLANT
CLOTH BEACON ORANGE TIMEOUT ST (SAFETY) ×3 IMPLANT
COVER LIGHT HANDLE STERIS (MISCELLANEOUS) ×6 IMPLANT
CUFF TOURNIQUET SINGLE 18IN (TOURNIQUET CUFF) ×3 IMPLANT
DECANTER SPIKE VIAL GLASS SM (MISCELLANEOUS) ×3 IMPLANT
DRSG ADAPTIC 3X8 NADH LF (GAUZE/BANDAGES/DRESSINGS) ×3 IMPLANT
ELECT REM PT RETURN 9FT ADLT (ELECTROSURGICAL) ×3
ELECTRODE REM PT RTRN 9FT ADLT (ELECTROSURGICAL) ×1 IMPLANT
GAUZE SPONGE 4X4 12PLY STRL (GAUZE/BANDAGES/DRESSINGS) ×3 IMPLANT
GLOVE BIO SURGEON STRL SZ7.5 (GLOVE) ×3 IMPLANT
GLOVE BIOGEL PI IND STRL 7.0 (GLOVE) ×2 IMPLANT
GLOVE BIOGEL PI INDICATOR 7.0 (GLOVE) ×4
GLOVE ECLIPSE 6.5 STRL STRAW (GLOVE) ×6 IMPLANT
GLOVE EXAM NITRILE LRG STRL (GLOVE) ×3 IMPLANT
GOWN STRL REUS W/TWL LRG LVL3 (GOWN DISPOSABLE) ×9 IMPLANT
KIT ROOM TURNOVER AP CYSTO (KITS) ×3 IMPLANT
MANIFOLD NEPTUNE II (INSTRUMENTS) ×3 IMPLANT
NEEDLE HYPO 27GX1-1/4 (NEEDLE) ×9 IMPLANT
NS IRRIG 1000ML POUR BTL (IV SOLUTION) ×3 IMPLANT
PACK BASIC LIMB (CUSTOM PROCEDURE TRAY) ×3 IMPLANT
PAD ARMBOARD 7.5X6 YLW CONV (MISCELLANEOUS) ×3 IMPLANT
SET BASIN LINEN APH (SET/KITS/TRAYS/PACK) ×3 IMPLANT
SPONGE LAP 18X18 X RAY DECT (DISPOSABLE) ×3 IMPLANT
SUT PROLENE 4 0 PS 2 18 (SUTURE) ×3 IMPLANT
SYRINGE 10CC LL (SYRINGE) ×6 IMPLANT

## 2014-02-25 NOTE — Anesthesia Preprocedure Evaluation (Signed)
Anesthesia Evaluation  Patient identified by MRN, date of birth, ID band Patient awake    Reviewed: Allergy & Precautions, H&P , NPO status , Patient's Chart, lab work & pertinent test results  Airway Mallampati: II  TM Distance: >3 FB Neck ROM: Full    Dental  (+) Teeth Intact, Implants   Pulmonary Current Smoker,  breath sounds clear to auscultation        Cardiovascular negative cardio ROS  Rhythm:Regular Rate:Normal     Neuro/Psych PSYCHIATRIC DISORDERS Anxiety    GI/Hepatic negative GI ROS,   Endo/Other    Renal/GU      Musculoskeletal   Abdominal   Peds  Hematology   Anesthesia Other Findings   Reproductive/Obstetrics                             Anesthesia Physical Anesthesia Plan  ASA: II  Anesthesia Plan: MAC   Post-op Pain Management:    Induction: Intravenous  Airway Management Planned: Nasal Cannula  Additional Equipment:   Intra-op Plan:   Post-operative Plan:   Informed Consent: I have reviewed the patients History and Physical, chart, labs and discussed the procedure including the risks, benefits and alternatives for the proposed anesthesia with the patient or authorized representative who has indicated his/her understanding and acceptance.     Plan Discussed with:   Anesthesia Plan Comments:         Anesthesia Quick Evaluation

## 2014-02-25 NOTE — H&P (Signed)
HISTORY AND PHYSICAL INTERVAL NOTE:  02/25/2014  11:30 AM  Melissa Sanders  has presented today for surgery, with the diagnosis of 4th hammer toe left foot.  The various methods of treatment have been discussed with the patient.  No guarantees were given.  After consideration of risks, benefits and other options for treatment, the patient has consented to surgery.  I have reviewed the patients' chart and labs.    Patient Vitals for the past 24 hrs:  BP Temp Temp src Pulse Resp SpO2 Height Weight  02/25/14 0859 98/68 mmHg 98.1 F (36.7 C) Oral 81 19 99 % 5\' 2"  (1.575 m) 115 lb (52.164 kg)    A history and physical examination was performed in my office.  The patient was reexamined.  There have been no changes to this history and physical examination.  Dallas SchimkeBenjamin Ivan Wyett Narine, DPM

## 2014-02-25 NOTE — Transfer of Care (Signed)
Immediate Anesthesia Transfer of Care Note  Patient: Deatra CanterSherry C Wrage  Procedure(s) Performed: Procedure(s): EXTENSOR TENOTOMY 4TH DIGIT LEFT FOOT  (Left) CAPSULOTOMY 4TH METATARSAL PHALANGEAL JOINT (Left)  Patient Location: PACU  Anesthesia Type:MAC  Level of Consciousness: awake, alert , oriented, patient cooperative and responds to stimulation  Airway & Oxygen Therapy: Patient Spontanous Breathing  Post-op Assessment: Report given to PACU RN, Post -op Vital signs reviewed and stable, Patient moving all extremities and Patient moving all extremities X 4  Post vital signs: Reviewed and stable  Complications: No apparent anesthesia complications

## 2014-02-25 NOTE — Anesthesia Procedure Notes (Signed)
Procedure Name: MAC Date/Time: 02/25/2014 12:00 PM Performed by: Shary DecampMOSES, Samaa Ueda Pre-anesthesia Checklist: Patient identified, Emergency Drugs available, Suction available, Timeout performed and Patient being monitored Patient Re-evaluated:Patient Re-evaluated prior to inductionOxygen Delivery Method: Non-rebreather mask

## 2014-02-25 NOTE — Anesthesia Postprocedure Evaluation (Signed)
  Anesthesia Post-op Note  Patient: Melissa CanterSherry C Hinojosa  Procedure(s) Performed: Procedure(s): EXTENSOR TENOTOMY 4TH DIGIT LEFT FOOT  (Left) CAPSULOTOMY 4TH METATARSAL PHALANGEAL JOINT (Left)  Patient Location: PACU  Anesthesia Type:MAC  Level of Consciousness: awake, alert , oriented, patient cooperative and responds to stimulation  Airway and Oxygen Therapy: Patient Spontanous Breathing  Post-op Pain: none  Post-op Assessment: Post-op Vital signs reviewed, Patient's Cardiovascular Status Stable, Respiratory Function Stable, Patent Airway, No signs of Nausea or vomiting and Pain level controlled  Post-op Vital Signs: Reviewed and stable  Last Vitals:  Filed Vitals:   02/25/14 1155  BP: 85/56  Pulse:   Temp:   Resp: 20    Complications: No apparent anesthesia complications

## 2014-02-25 NOTE — Op Note (Signed)
OPERATIVE NOTE  DATE OF PROCEDURE:  02/25/2014  SURGEON:   Dallas SchimkeBenjamin Ivan Dejaun Vidrio, DPM  OR STAFF:   Circulator: Larwance RoteHolly Renee Protzek, RN Scrub Person: Jari SportsmanMaggie Potter Henderson, CST RN First Assistant: Eliane Decreeatherine Jann Page, RN   PREOPERATIVE DIAGNOSIS:   Hammertoe 4th digit, left foot (ICD-10 M20.42)  POSTOPERATIVE DIAGNOSIS: Same  PROCEDURE: 1.  Extensor tenotomy of the 4th digit, left foot 2.  Capsulotomy of the 4th metatarsophalangeal joint, left foot  ANESTHESIA:  Monitor Anesthesia Care   HEMOSTASIS:   Pneumatic ankle tourniquet set at 250 mmHg  ESTIMATED BLOOD LOSS:   Minimal (<5 cc)  MATERIALS USED:  None  INJECTABLES: Marcaine 0.5% plain  PATHOLOGY:   None  COMPLICATIONS:   None  INDICATIONS:  Extensus deformity of the fourth digit of the left foot following hammertoe repair.  DESCRIPTION OF THE PROCEDURE:   The patient was brought to the operating room and placed on the operative table in the supine position.  A pneumatic ankle tourniquet was applied to the patient's ankle.  Following sedation, the surgical site was anesthetized with 0.5% Marcaine plain.  The foot was then prepped, scrubbed, and draped in the usual sterile technique.  The foot was elevated, exsanguinated and the pneumatic ankle tourniquet inflated to 250 mmHg.    Attention was directed to the dorsal aspect of the left fourth toe.  The extensor tendon to the left fourth toe was found to be taut.  With contracture of the scar along the dorsal aspect of the left fourth toe.  2 converging semi-elliptical incisions were performed encompassing the surgical scar.  The wedge of skin was removed and passed from the operative field.  Dissection was continued deep down to the level of the extensor tendon.  The extensor tendon was transected.  Dissection was continued deep down to the level of the fourth metatarsal phalangeal joint.  A transverse capsulotomy was performed.  The extensus deformity reduced.  The  surgical wound was irrigated with copious amounts of sterile irrigant.  The subcutaneous structures were reapproximated using 4-0 Vicryl.  The skin was reapproximated using 4-0 Prolene.   The patient tolerated the procedure well.  The patient was then transferred to PACU with vital signs stable and vascular status intact to all toes of the operative foot.  Following a period of postoperative monitoring, the patient will be discharged home.

## 2014-02-26 ENCOUNTER — Encounter (HOSPITAL_COMMUNITY): Payer: Self-pay | Admitting: Podiatry

## 2015-03-21 IMAGING — CR DG FOOT COMPLETE 3+V*L*
3 series · 3 of 3 positions shown · non-contrast
Comparison: None.

CLINICAL DATA: Preop for hammertoe deformity

EXAM:
LEFT FOOT - COMPLETE 3+ VIEW

[view not recorded (1 of 3)]
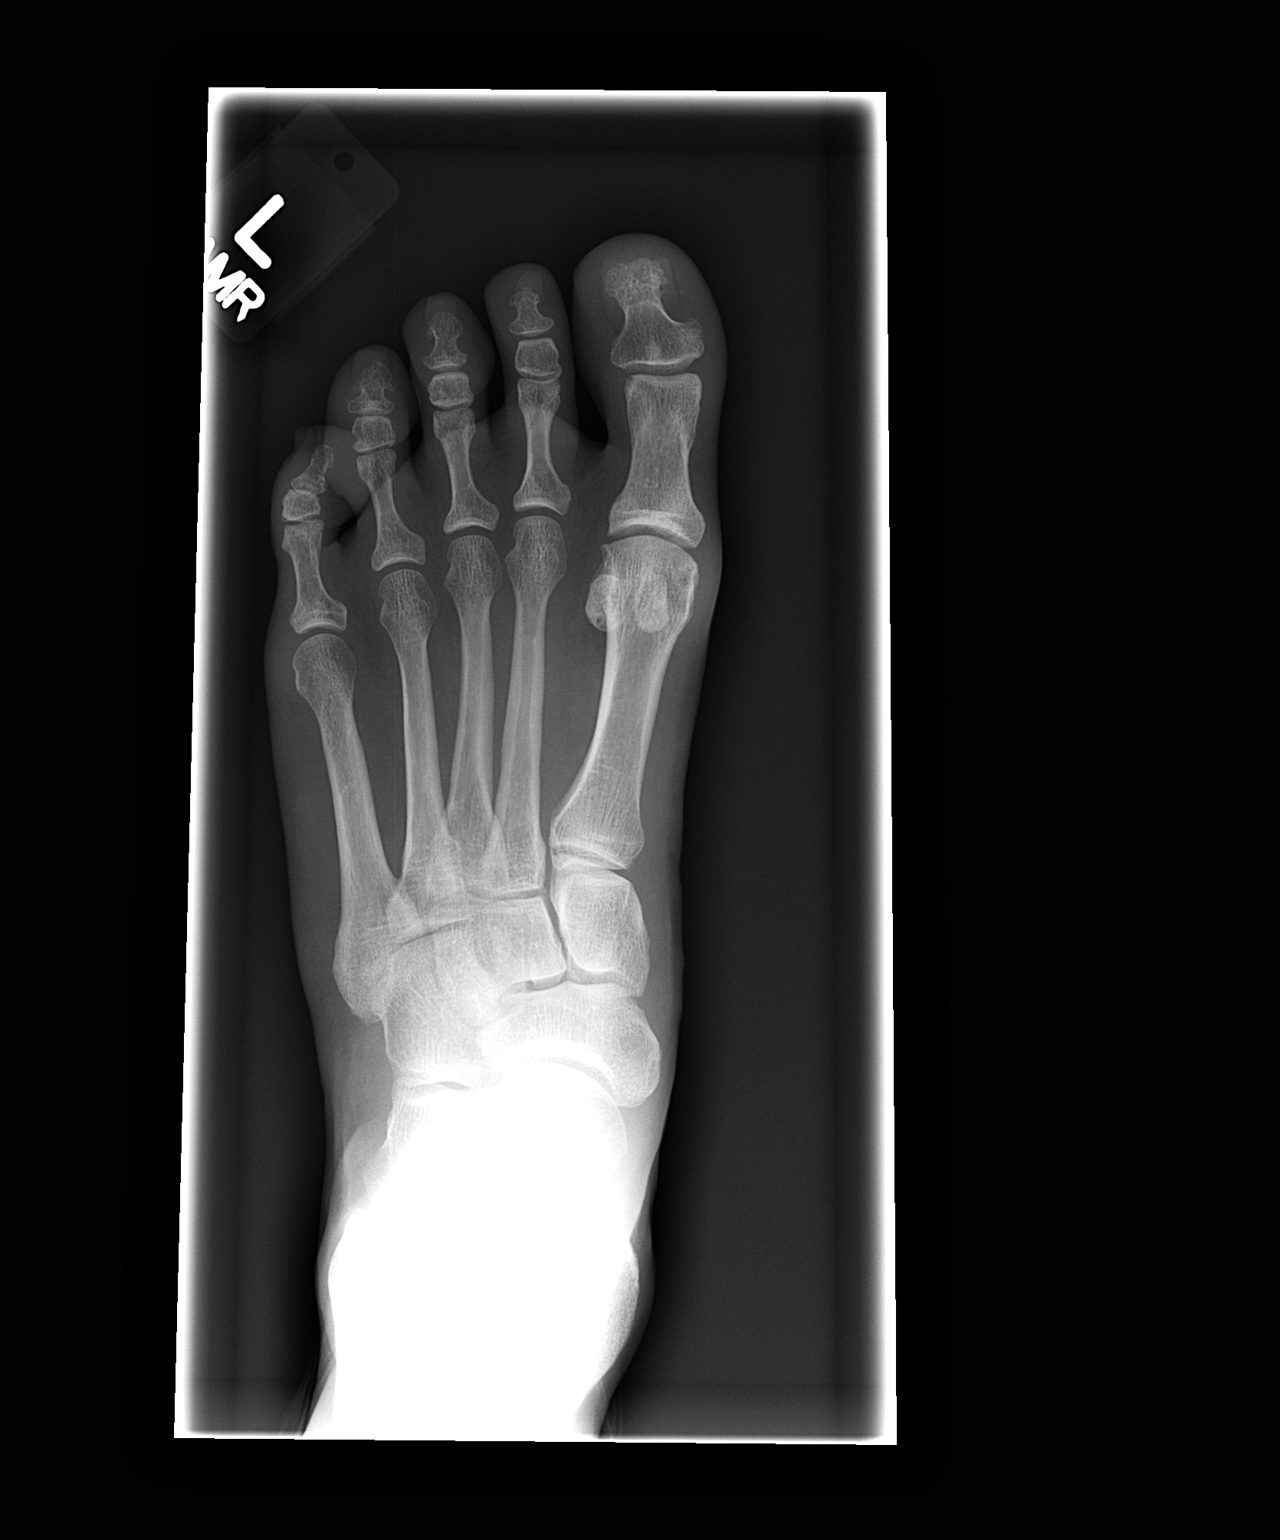

[view not recorded (2 of 3)]
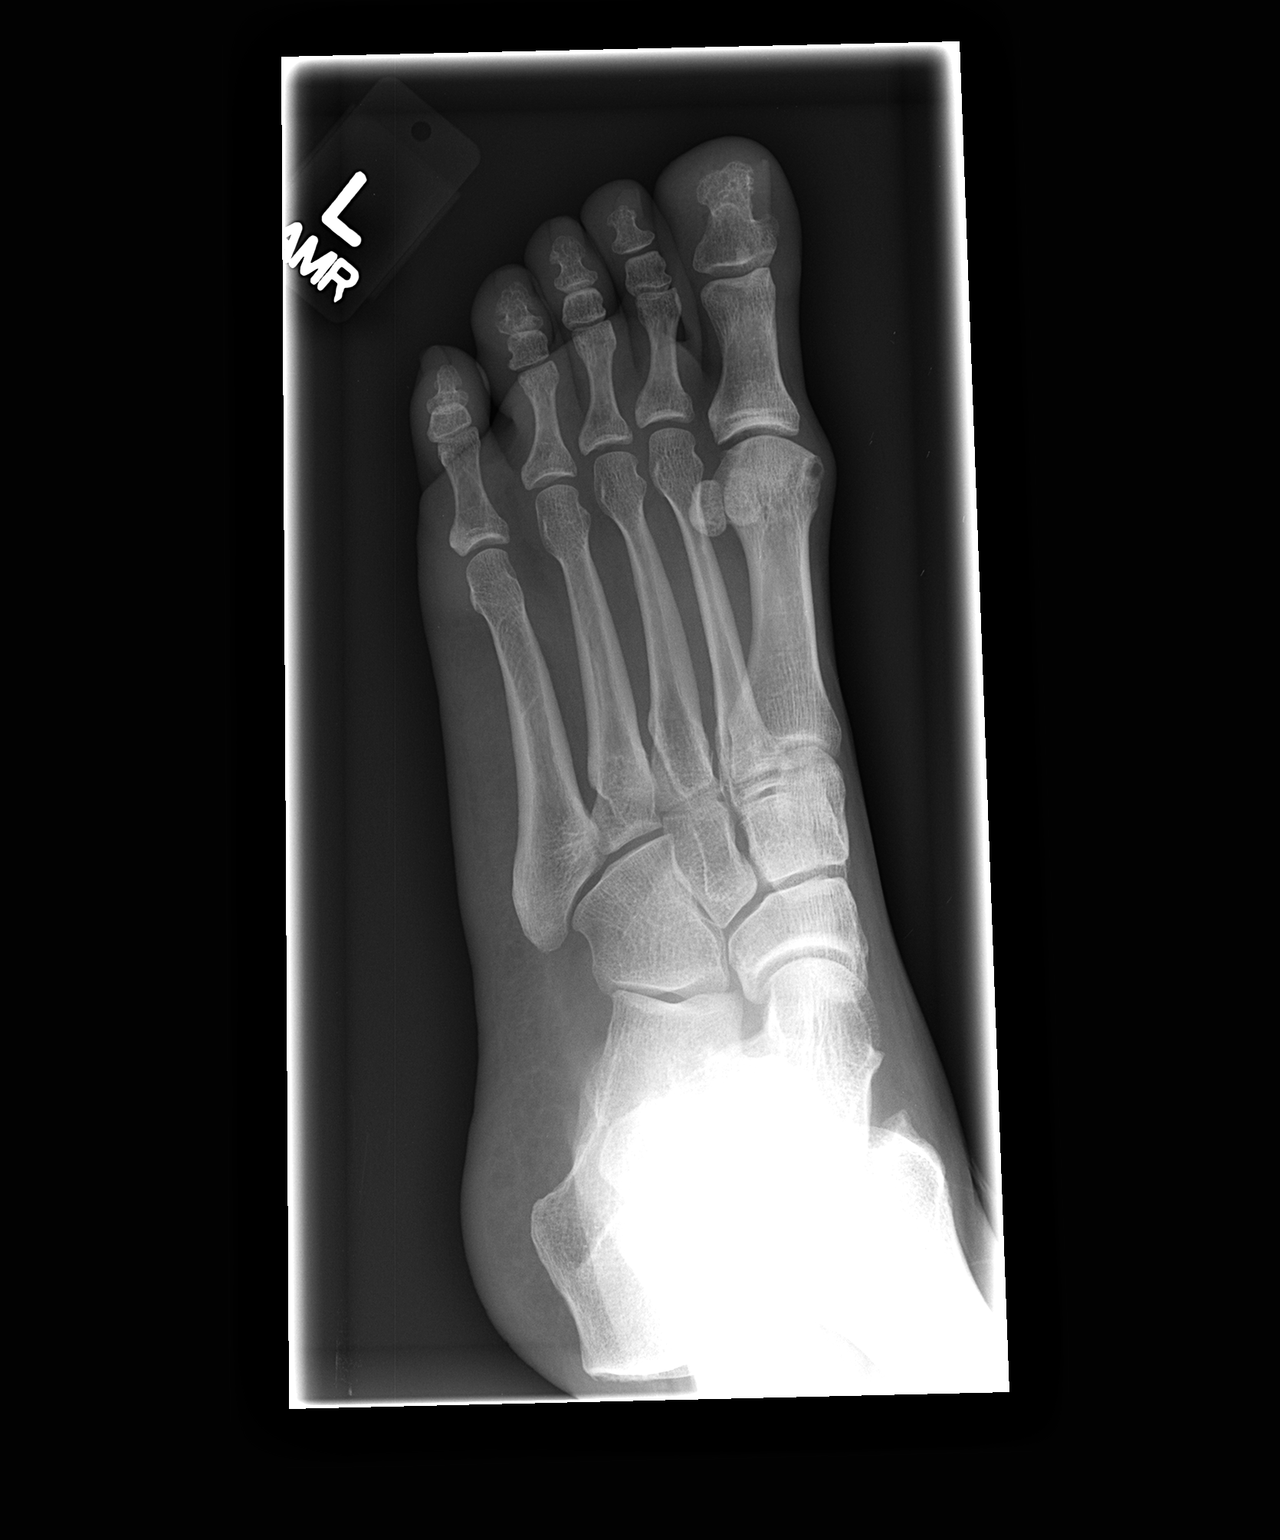

[view not recorded (3 of 3)]
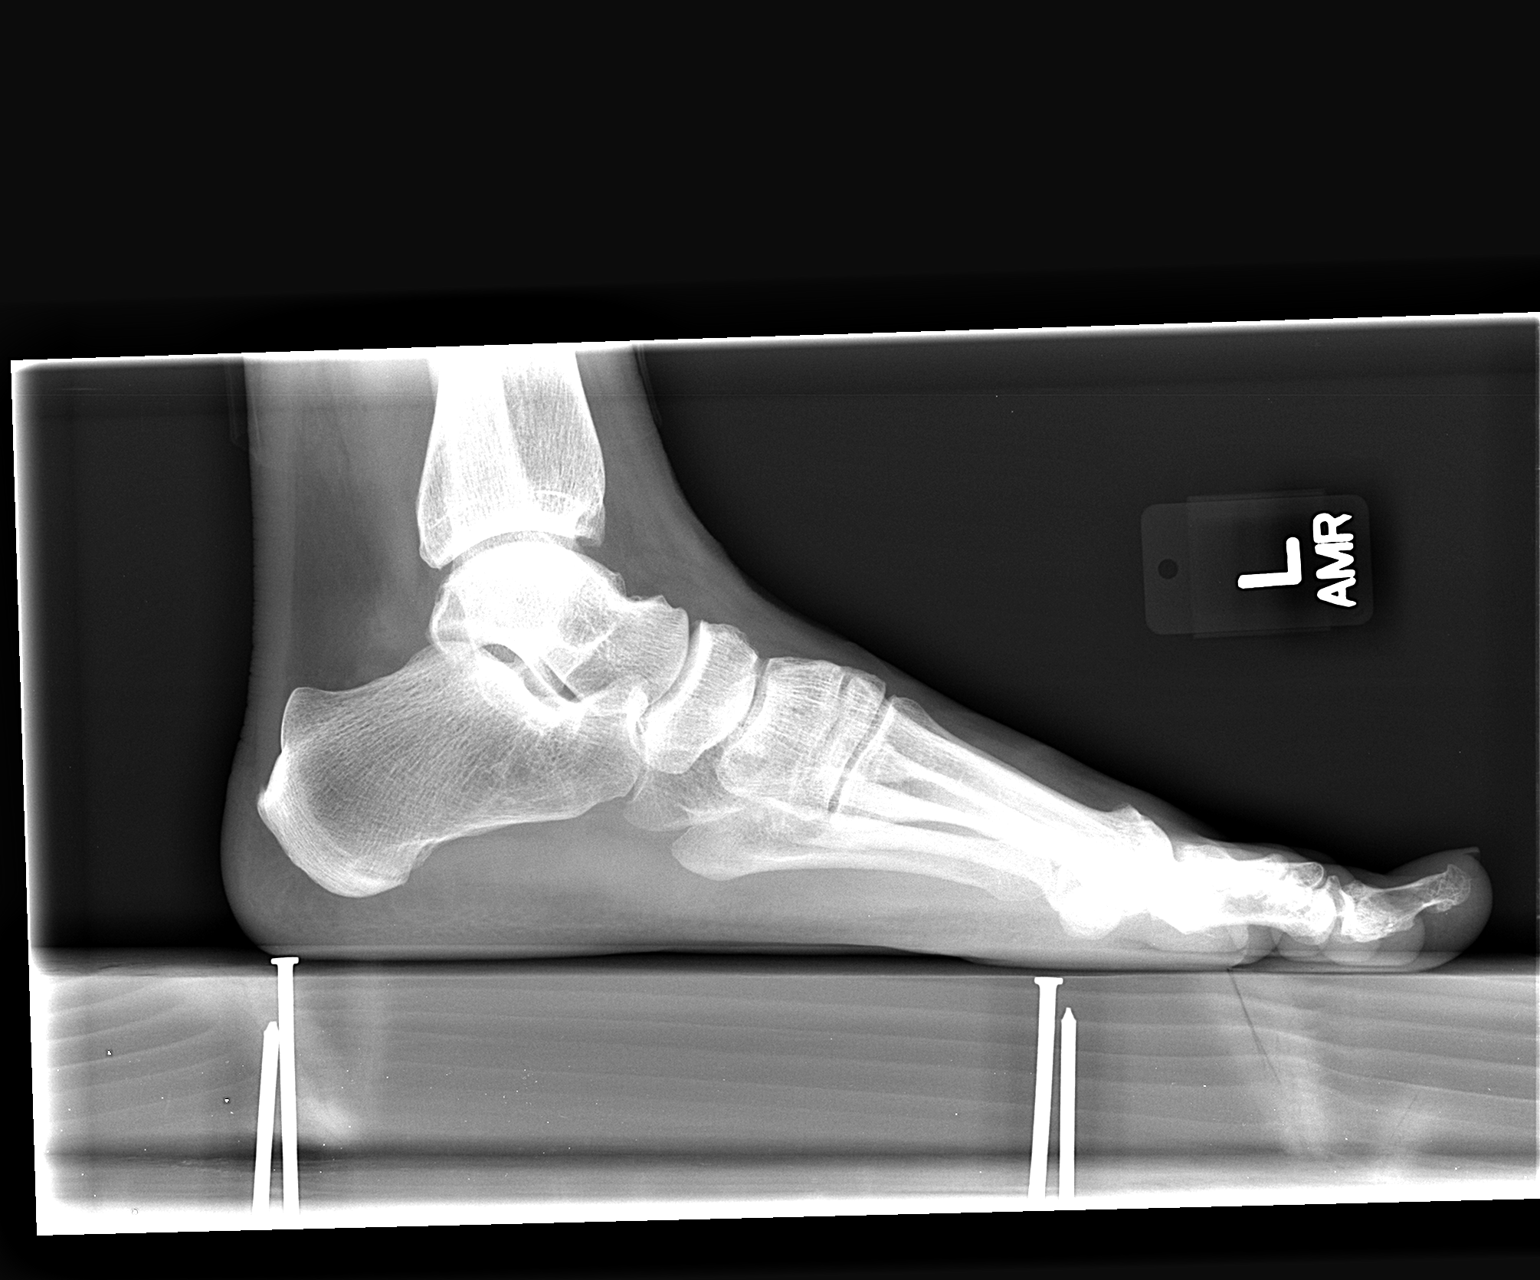

[3 of 3 positions shown; findings below may reference images not displayed]

FINDINGS: There is no fracture or dislocation. There are mild degenerative
changes of the 1st MTP joint. The soft tissues are normal.
IMPRESSION: No acute osseous injury of the left foot.

## 2015-03-29 IMAGING — CR DG FOOT COMPLETE 3+V*L*
3 series · 3 of 3 positions shown · non-contrast
Comparison: None.

CLINICAL DATA: Post Op

EXAM:
LEFT FOOT - COMPLETE 3+ VIEW

[ap]
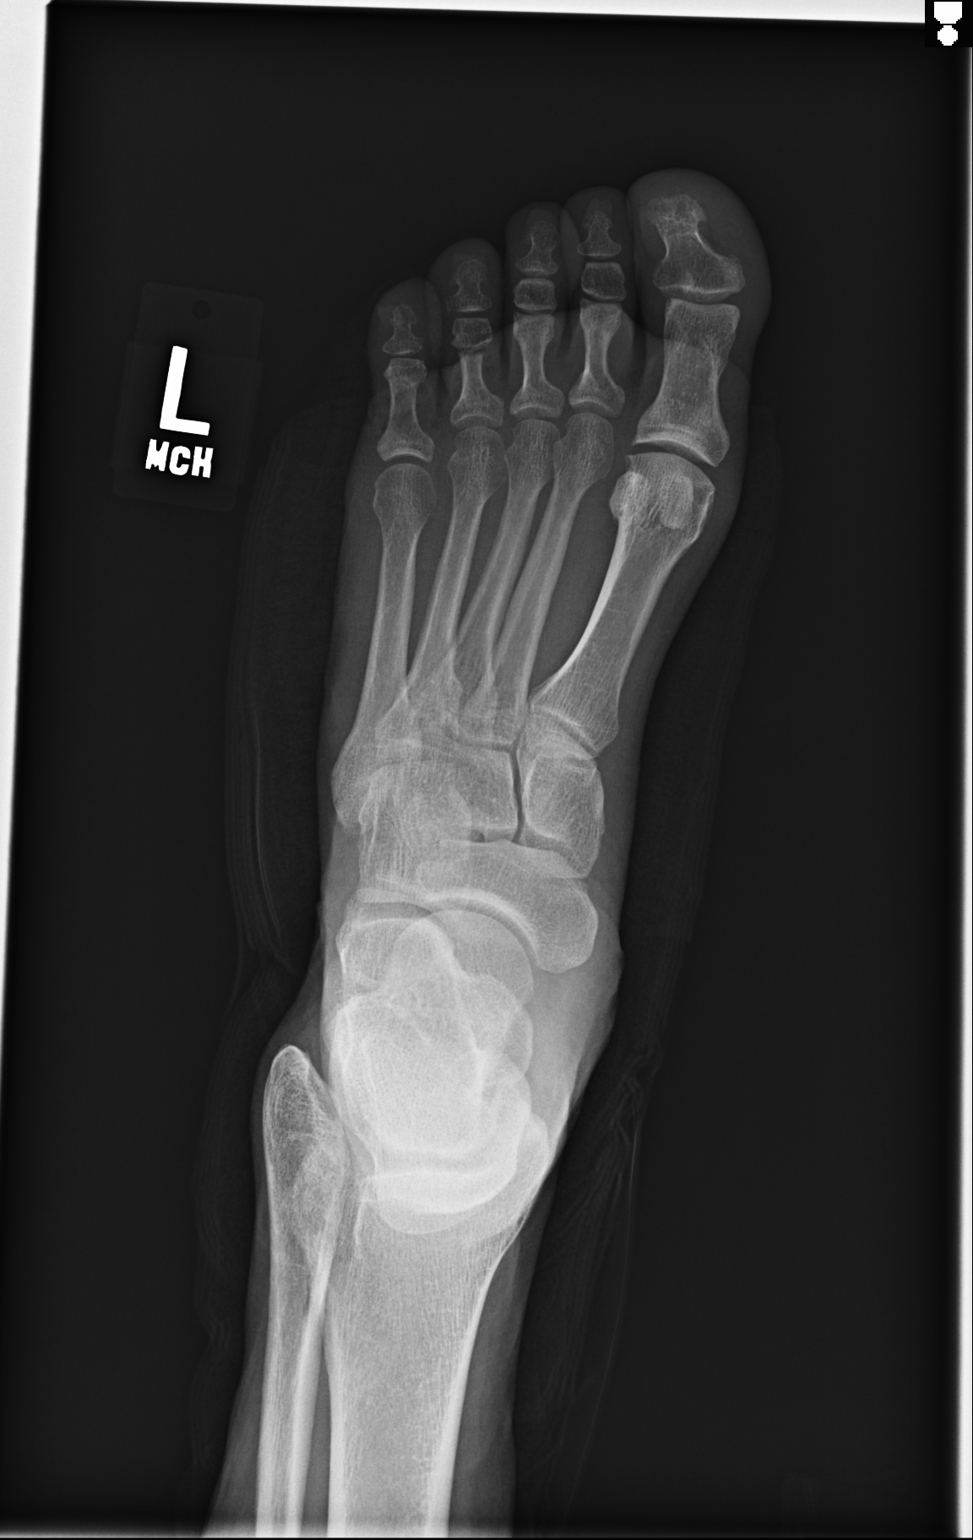

[oblique]
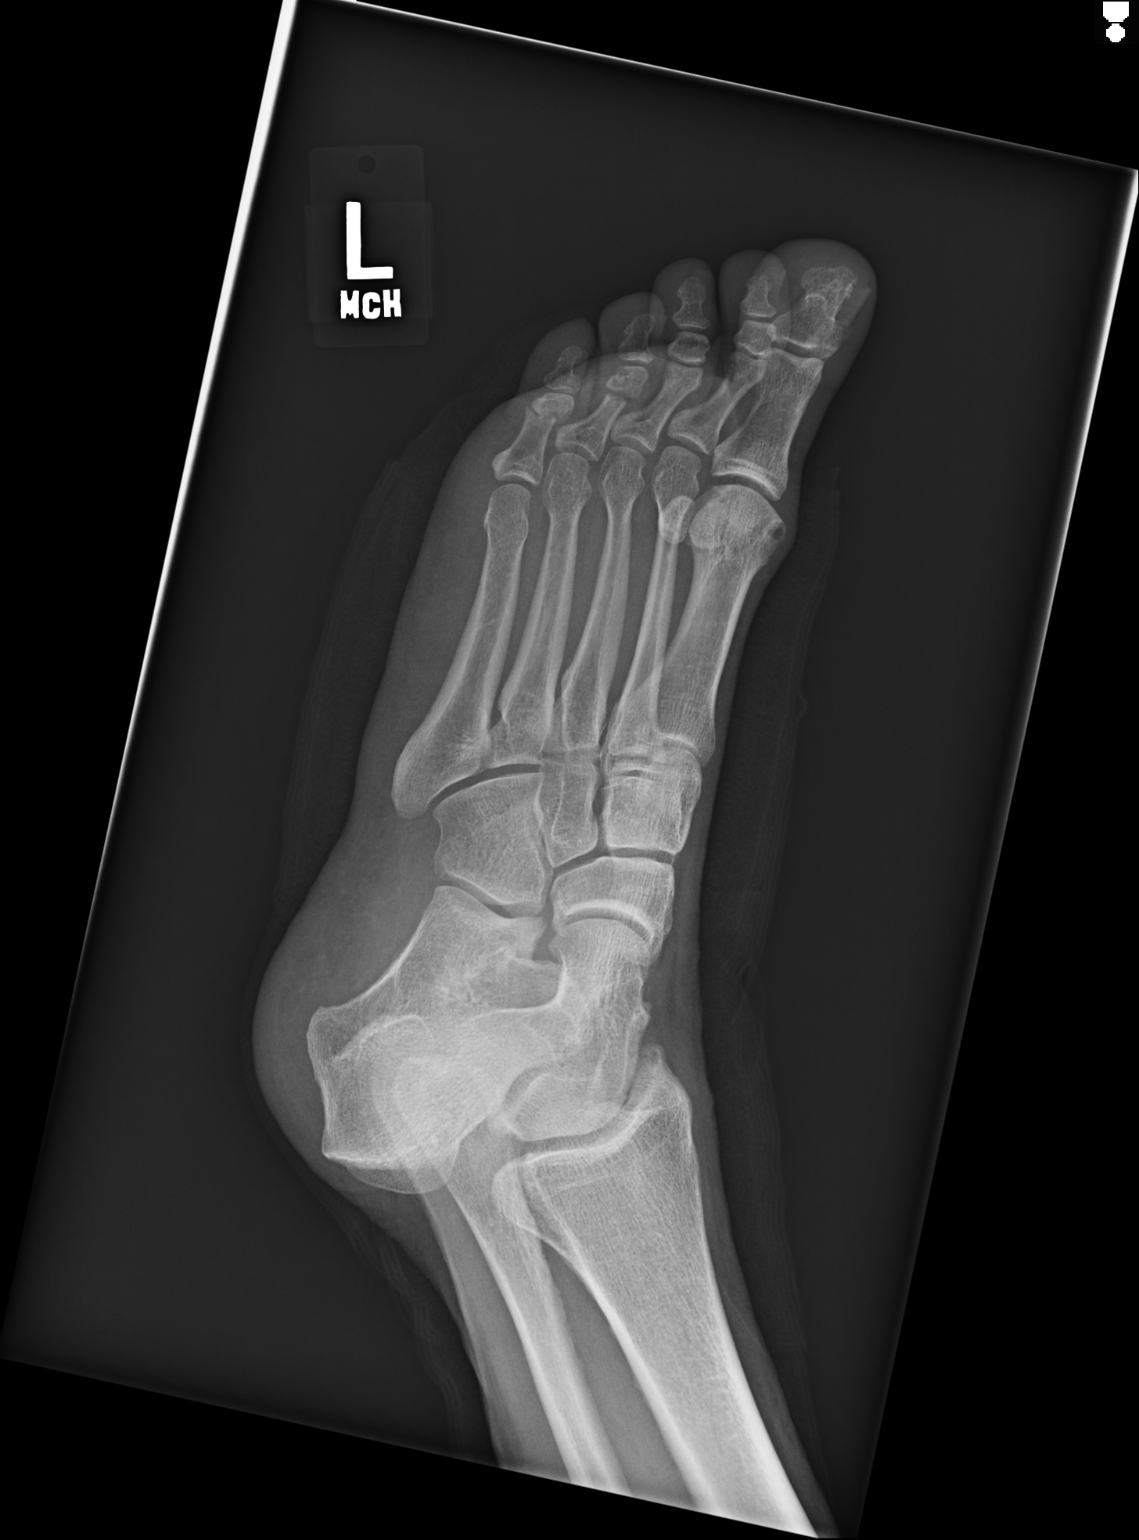

[lat]
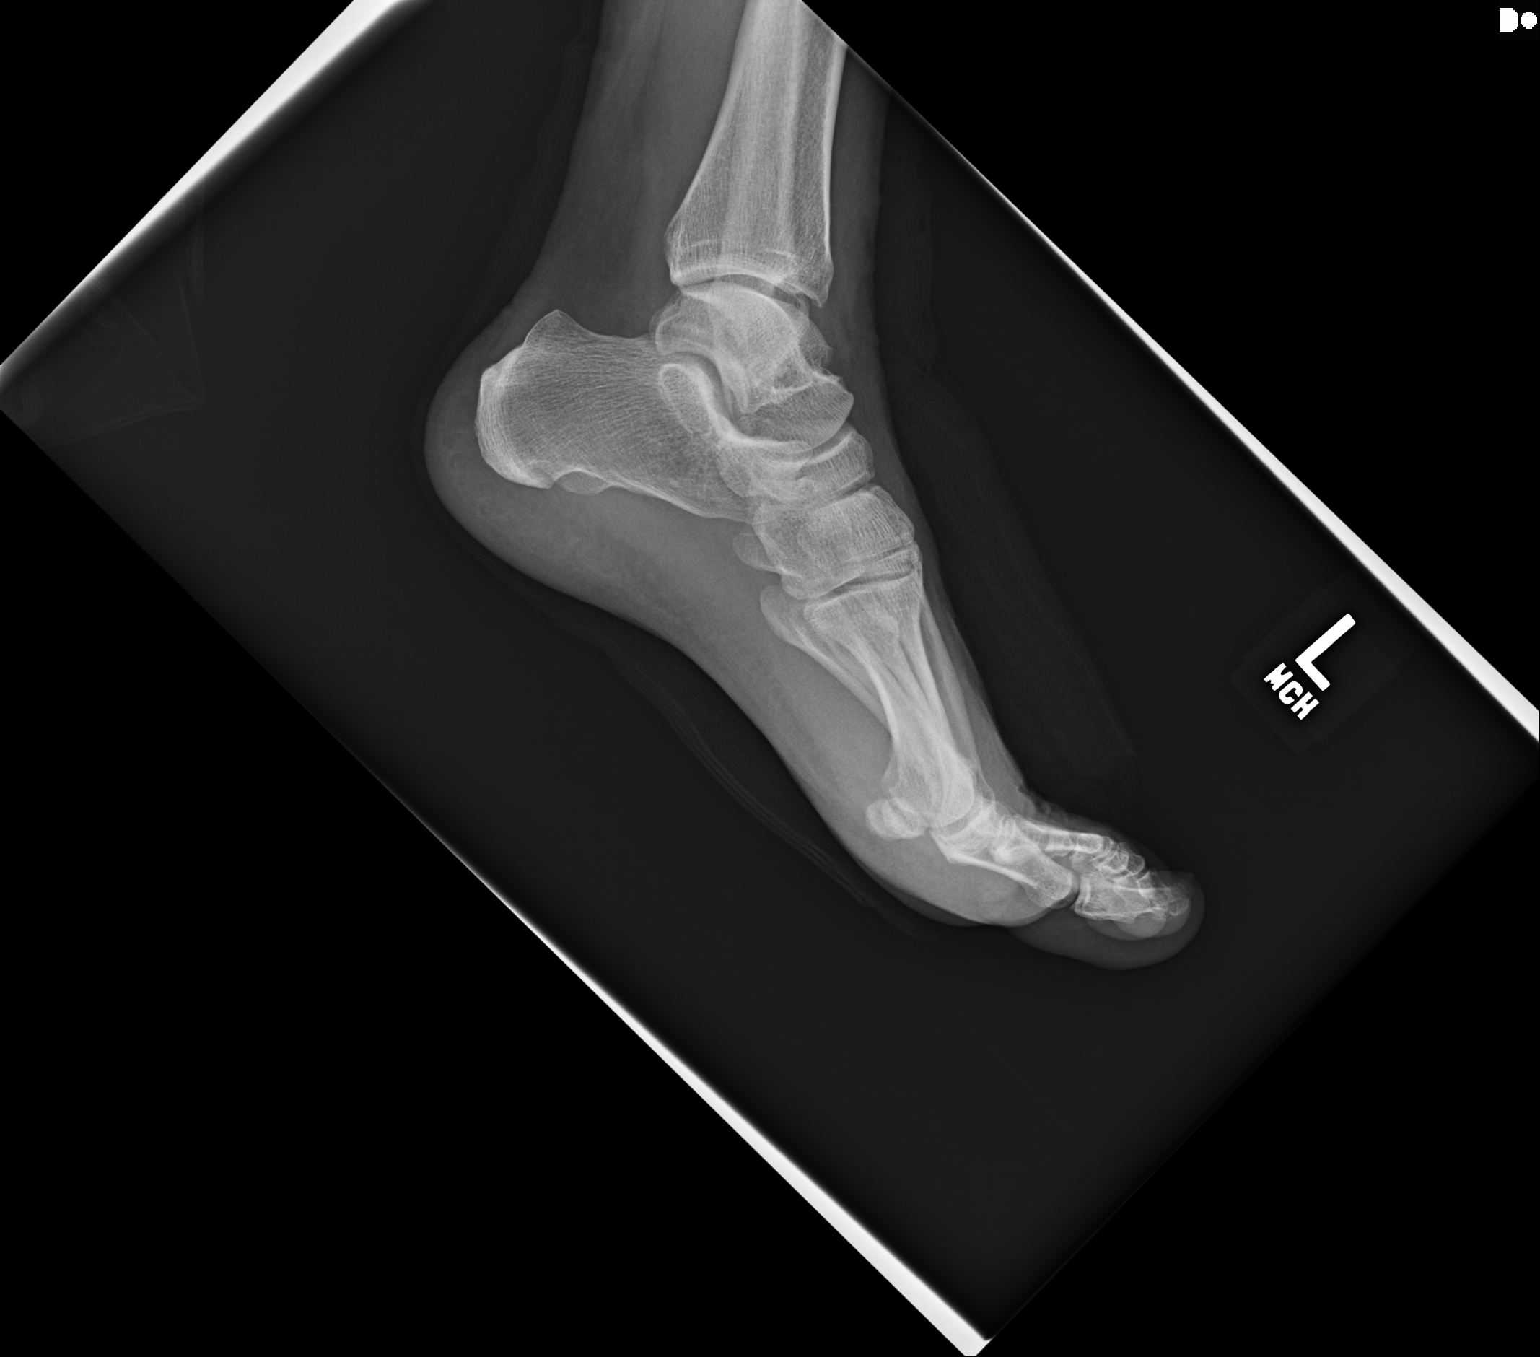

[3 of 3 positions shown; findings below may reference images not displayed]

FINDINGS: The left foot demonstrates no fracture or dislocation. There is mild
degenerative change of the first MTP joint. There is no soft tissue
abnormality. There is no subcutaneous emphysema or radiopaque
foreign bodies.
IMPRESSION: No acute osseous injury of the left foot.
